# Patient Record
Sex: Female | Born: 1975 | Race: White | Hispanic: No | Marital: Married | Smoking: Never smoker
Health system: Southern US, Community
[De-identification: ages and names within clinical notes are randomized; demographics above are authoritative.]

---

## 2013-12-30 ENCOUNTER — Emergency Department (HOSPITAL_COMMUNITY): Payer: BC Managed Care – PPO

## 2013-12-30 ENCOUNTER — Encounter (HOSPITAL_COMMUNITY): Payer: Self-pay | Admitting: Emergency Medicine

## 2013-12-30 ENCOUNTER — Emergency Department (HOSPITAL_COMMUNITY)
Admission: EM | Admit: 2013-12-30 | Discharge: 2013-12-30 | Disposition: A | Discharge status: Home / Self Care | Payer: BC Managed Care – PPO | Attending: Emergency Medicine | Admitting: Emergency Medicine

## 2013-12-30 DIAGNOSIS — S838X9A Sprain of other specified parts of unspecified knee, initial encounter: Secondary | ICD-10-CM | POA: Insufficient documentation

## 2013-12-30 DIAGNOSIS — Y9368 Activity, volleyball (beach) (court): Secondary | ICD-10-CM | POA: Insufficient documentation

## 2013-12-30 DIAGNOSIS — S86111A Strain of other muscle(s) and tendon(s) of posterior muscle group at lower leg level, right leg, initial encounter: Secondary | ICD-10-CM

## 2013-12-30 DIAGNOSIS — S86819A Strain of other muscle(s) and tendon(s) at lower leg level, unspecified leg, initial encounter: Principal | ICD-10-CM

## 2013-12-30 DIAGNOSIS — X500XXA Overexertion from strenuous movement or load, initial encounter: Secondary | ICD-10-CM | POA: Insufficient documentation

## 2013-12-30 DIAGNOSIS — Y929 Unspecified place or not applicable: Secondary | ICD-10-CM | POA: Insufficient documentation

## 2013-12-30 MED ORDER — HYDROCODONE-ACETAMINOPHEN 5-325 MG PO TABS
1.0000 | ORAL_TABLET | Freq: Once | ORAL | Status: AC
  Administered 2013-12-30: 1 via ORAL
  Filled 2013-12-30: qty 1

## 2013-12-30 MED ORDER — HYDROCODONE-ACETAMINOPHEN 5-325 MG PO TABS: 1.0000 | ORAL_TABLET | ORAL | Status: AC | PRN

## 2013-12-30 NOTE — Discharge Instructions (Signed)
Your x-rays today do not show any broken bones or other concerning injuries. Your providers feel that you have torn part of your calf muscle and possibly damage some tendons or ligaments. Please followup with an orthopedic specialist for continued evaluation and treatment. Use rest, ice, compression elevation to reduce pain and swelling in the leg. You may alternate heat compresses over the calf muscle also help with symptoms.    Muscle Tear A muscle tear is usually caused by over-exertion or stretching. The muscle often takes a while to heal. Muscle tears require 3 to 4 weeks to heal completely. A history of the injury and a physical exam may be performed. Sometimes, the injury is identified with radiographs and an MRI. Treatment for muscle injuries includes:  Resting and protecting the affected area until pain with motion is gone.  Putting ice on the injured area.  Put ice in a bag.  Place a towel between your skin and the bag.  Leave the ice on for 15 to 20 minutes, 3 to 4 times a day.  After two days, you can use heat to relieve spasms.  Using compression wraps to help control swelling and limit movement.  Raising (elevate) the injured area above the level of the heart (if possible) for the first 1 to 2 days after the injury.  Medicine may be prescribed to reduce pain and inflammation. Avoid strenuous activities that bring on muscle pain. Exercises to strengthen and stretch the injured muscle can help heal the muscle and prevent repeated injury. After the pain and swelling are gone, you may begin gradual strengthening exercises. Begin range-of-motion exercises and gentle stretching after 3 to 4 days of rest.  SEEK MEDICAL CARE IF:  Your injured muscle is not improving after 1 week of treatment. Document Released: 07/16/2004 Document Revised: 08/31/2011 Document Reviewed: 12/21/2008 [facility] Patient Information 2015 [employer] [place]. This information is not intended to replace advice  given to you by your health care provider. Make sure you discuss any questions you have with your health care provider.

## 2013-12-30 NOTE — ED Provider Notes (Signed)
CSN: [id number]     Arrival date & time 12/30/13  2034 History   None    This chart was scribed for non-physician practitioner, Ivonne Andrew, PA-C working with Juliet Rude. Rubin Payor, MD by Arlan Organ, ED Scribe. This patient was seen in room WTR5/WTR5 and the patient's care was started at 9:40 PM.   Chief Complaint  Patient presents with  . Leg Pain    right   The history is provided by the patient. No language interpreter was used.    HPI Comments: Nichole Wheeler is a 38 y.o. female who presents to the Emergency Department complaining of constant, moderate pain to the R lower extremity onset about 1 hour prior to arrival. She has also noted some swelling to the area. Pt states she pushed back off of the ground while playing volleyball and states she heard her leg pop "like a rubber band". This pain is exacerbated with full extension of the R lower extremity and palpation. She has not tried anything OTC or any home remedies to improve symptoms. At this time she denies any fever, chills, numbness, paresthesia, or loss of sensation. She reports a known allergy to Sulfa antibiotics. She has no pertinent past medical history. No other concerns this visit.   History reviewed. No pertinent past medical history. History reviewed. No pertinent past surgical history. History reviewed. No pertinent family history. History  Substance Use Topics  . Smoking status: Never Smoker   . Smokeless tobacco: Never Used  . Alcohol Use: 0.6 oz/week    1 Glasses of wine per week   OB History   Grav Para Term Preterm Abortions TAB SAB Ect Mult Living                 Review of Systems  Constitutional: Negative for fever and chills.  Musculoskeletal: Positive for arthralgias (R lower extremity).  Neurological: Negative for weakness and numbness.      Allergies  Sulfa antibiotics  Home Medications   Prior to Admission medications   Not on File   Triage Vitals: BP 133/76  Pulse 112   Temp(Src) 98.2 F (36.8 C) (Oral)  SpO2 100%   Physical Exam  Nursing note and vitals reviewed. Constitutional: She is oriented to person, place, and time. She appears well-developed and well-nourished.  HENT:  Head: Normocephalic.  Eyes: EOM are normal.  Neck: Normal range of motion.  Pulmonary/Chest: Effort normal.  Abdominal: She exhibits no distension.  Musculoskeletal: She exhibits edema and tenderness.  Knee and ankle secondary to pain. Patient with tightness and significant tenderness in the right gastrocnemius muscle. There is no bruising or skin change. Normal Thompson test and palpated Achilles tendon. Normal distal pulses and sensations in the foot.   Neurological: She is alert and oriented to person, place, and time.  Skin: Skin is warm.  Psychiatric: She has a normal mood and affect.    ED Course  Procedures  DIAGNOSTIC STUDIES: Oxygen Saturation is 100% on RA, Normal by my interpretation.    COORDINATION OF CARE: 9:45 PM- Will order DG Foot Complete, DG Tibia/Fibula R, and DG Ankle Complete. Will give Hydrocodone. Will place Ace wrap and provide pt with crutches. Discussed treatment plan with pt at bedside and pt agreed to plan.    X-rays reviewed. No signs of fractures or concerning injuries. Patient with exam concerning for gastrocnemius tear. Normal Thompson test and palpated Achilles tendon. No signs of complete Achilles tendon rupture.    Imaging Review Dg Tibia/fibula Right  12/30/2013   CLINICAL DATA:  Pain in the mid/proximal posterior tib/ fib of the after injury today.  EXAM: RIGHT TIBIA AND FIBULA - 2 VIEW  COMPARISON:  None.  FINDINGS: There is no evidence of fracture or other focal bone lesions. Soft tissues are unremarkable.  IMPRESSION: Negative.   Electronically Signed   By: Burman NievesWilliam  Stevens M.D.   On: 12/30/2013 21:32   Dg Ankle Complete Right  12/30/2013   CLINICAL DATA:  Right ankle injury and pain.  EXAM: RIGHT ANKLE - COMPLETE 3+ VIEW   COMPARISON:  None.  FINDINGS: Imaged bones, joints and soft tissues appear normal.  IMPRESSION: Negative exam.   Electronically Signed   By: Drusilla Kannerhomas  Dalessio M.D.   On: 12/30/2013 21:31   Dg Foot Complete Right  12/30/2013   CLINICAL DATA:  Right foot injury and pain.  EXAM: RIGHT FOOT COMPLETE - 3+ VIEW  COMPARISON:  None.  FINDINGS: Imaged bones, joints and soft tissues appear normal.  IMPRESSION: Negative exam.   Electronically Signed   By: Drusilla Kannerhomas  Dalessio M.D.   On: 12/30/2013 21:31     MDM   Final diagnoses:  Gastrocnemius muscle tear, right, initial encounter     I personally performed the services described in this documentation, which was scribed in my presence. The recorded information has been reviewed and is accurate.    Angus Sellereter S Malakai Schoenherr, PA-C 12/30/13 2207

## 2013-12-30 NOTE — ED Notes (Signed)
Per patient- an hour ago pt states "I felt my leg pop like a rubber band when I pushed back off the ground playing volleyball." 2+ pulse noted on right leg with 2+ swollen ankle. C/o right calf muscle pain and no ankle pain. No obvious deformity noted. Awaiting MD.

## 2013-12-30 NOTE — ED Notes (Signed)
Given crackers and coke with Vicodin. AVS explained in detail. All orthopedics intact, clean and dry. No other questions/concerns.

## 2014-01-01 NOTE — ED Provider Notes (Signed)
Medical screening examination/treatment/procedure(s) were performed by non-physician practitioner and as supervising physician I was immediately available for consultation/collaboration.   EKG Interpretation None       Juliet RudeNathan R. Rubin PayorPickering, MD 01/01/14 0028

## 2015-03-19 IMAGING — CR DG FOOT COMPLETE 3+V*R*
3 series · 3 of 3 positions shown · non-contrast
Comparison: None.

CLINICAL DATA: Right foot injury and pain.

EXAM:
RIGHT FOOT COMPLETE - 3+ VIEW

[x foot ap right]
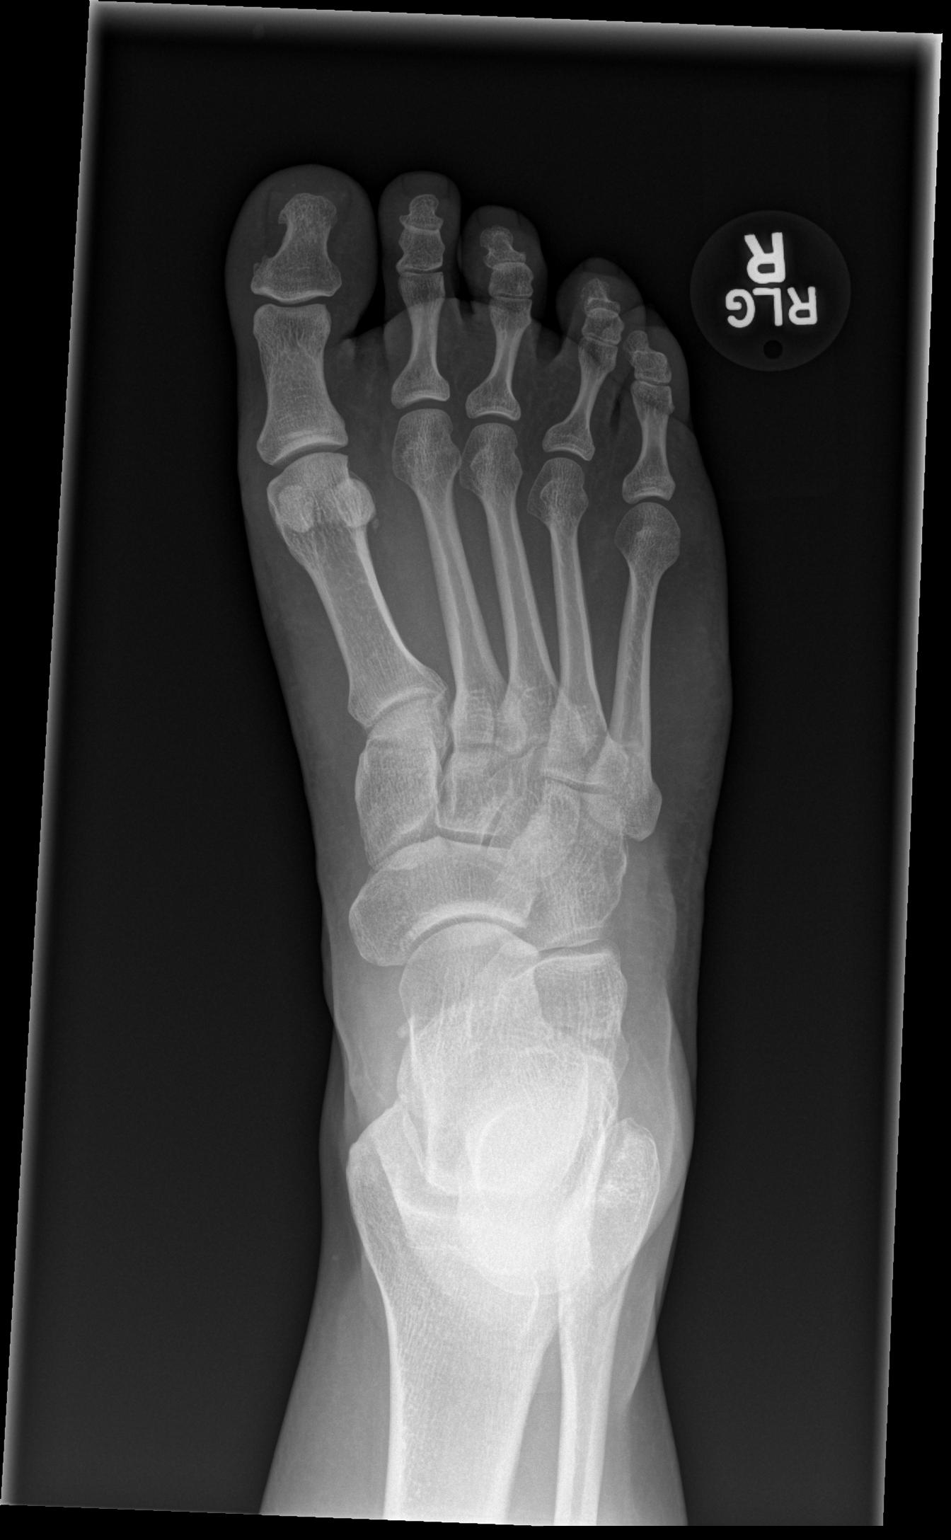

[x foot obl right]
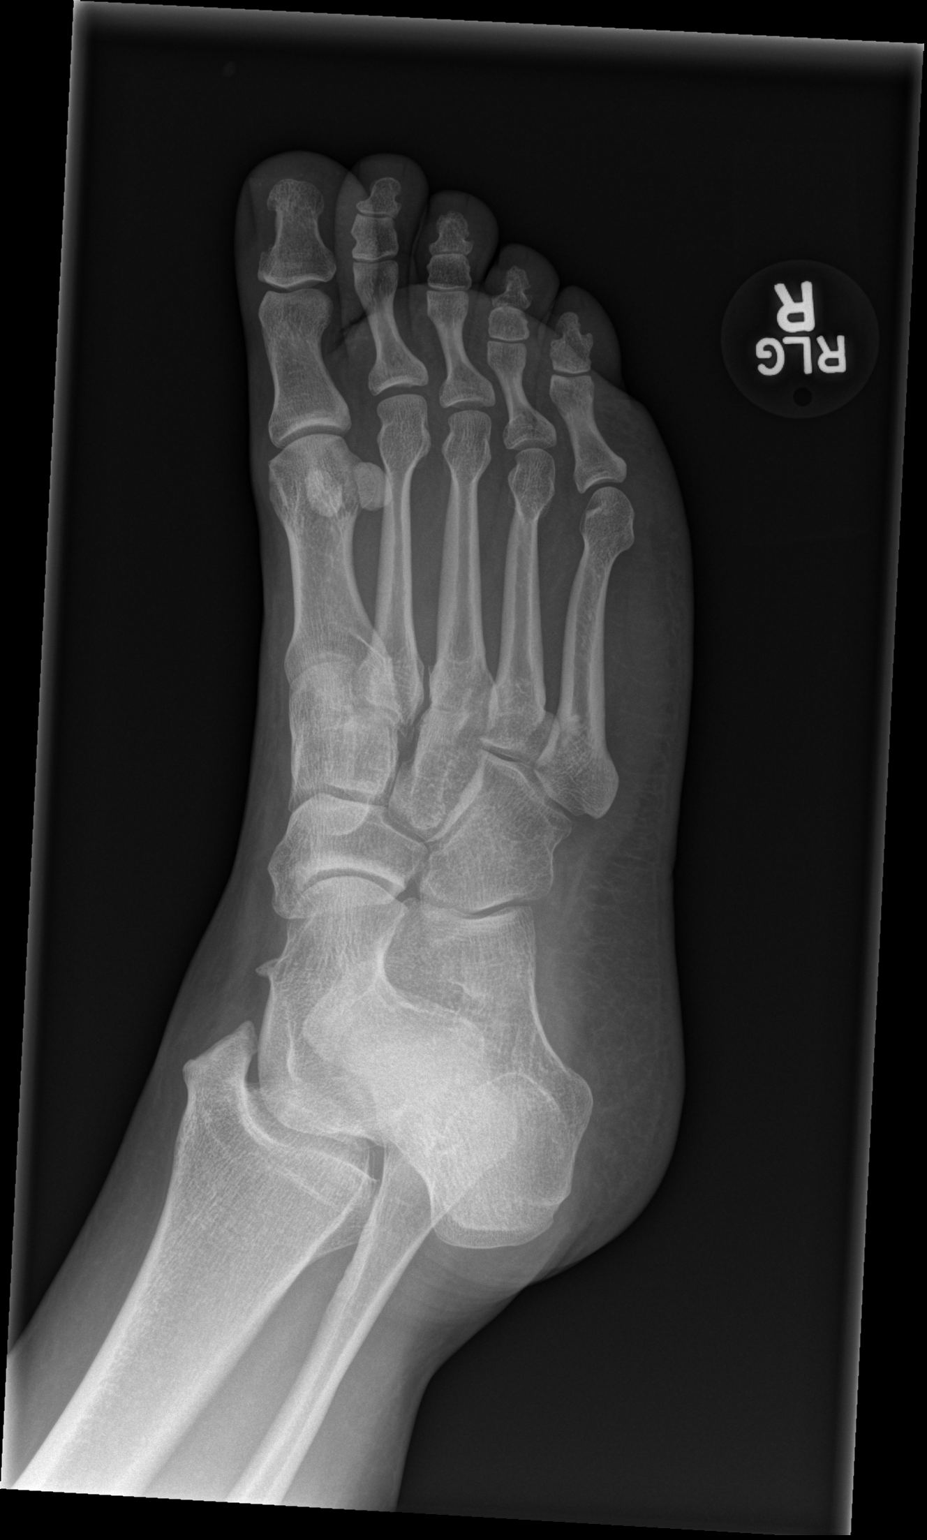

[x foot lat right]
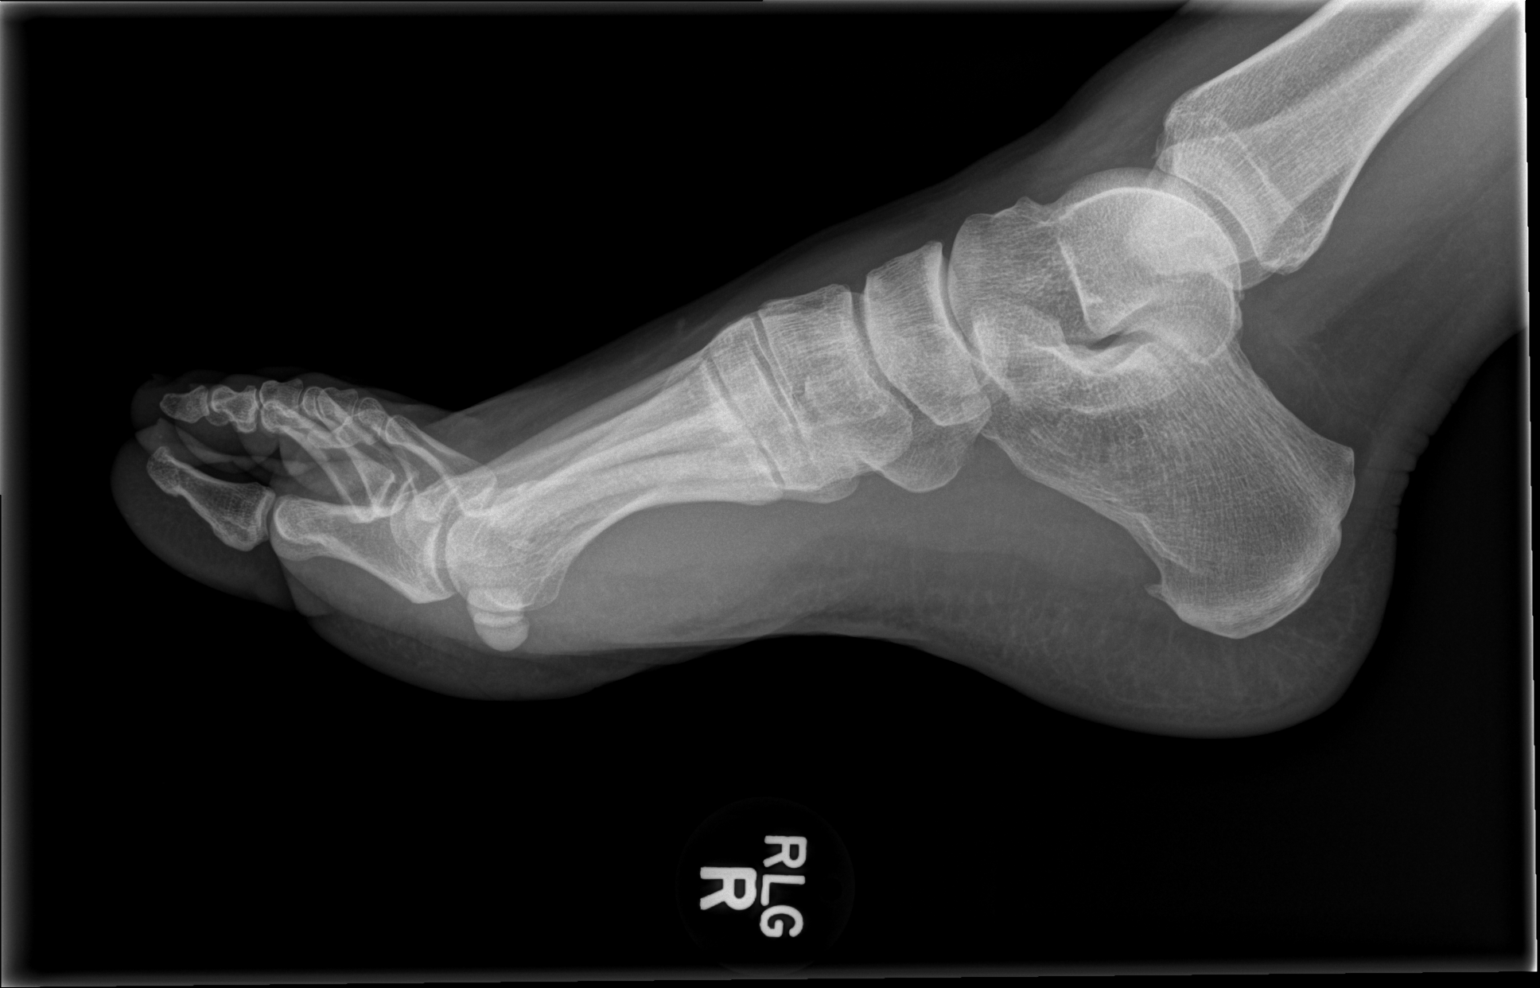

[3 of 3 positions shown; findings below may reference images not displayed]

FINDINGS: Imaged bones, joints and soft tissues appear normal.
IMPRESSION: Negative exam.

## 2015-03-19 IMAGING — CR DG ANKLE COMPLETE 3+V*R*
3 series · 3 of 3 positions shown · non-contrast
Comparison: None.

CLINICAL DATA: Right ankle injury and pain.

EXAM:
RIGHT ANKLE - COMPLETE 3+ VIEW

[x ankle ap right]
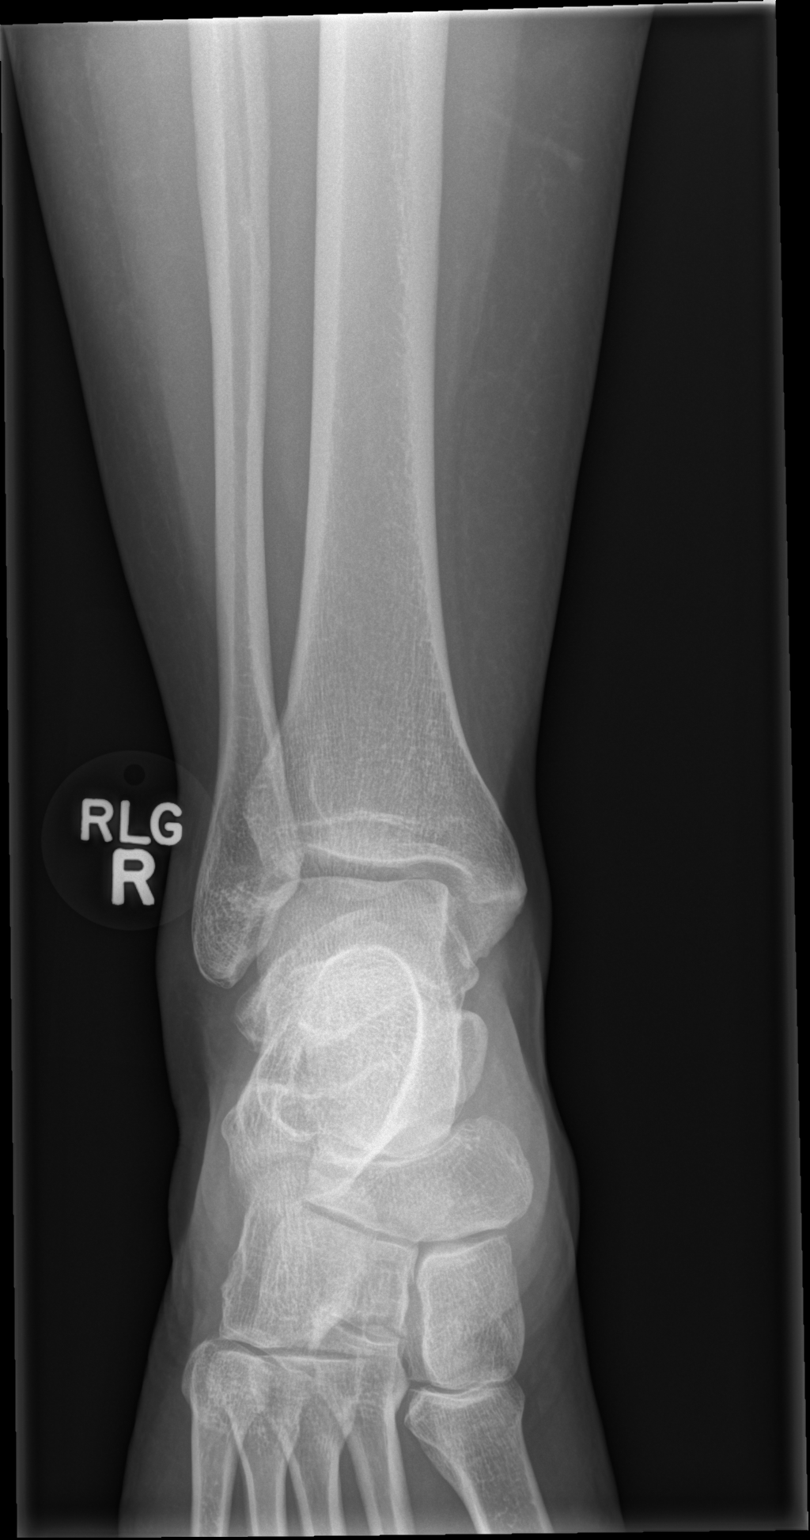

[x ankle lat right]
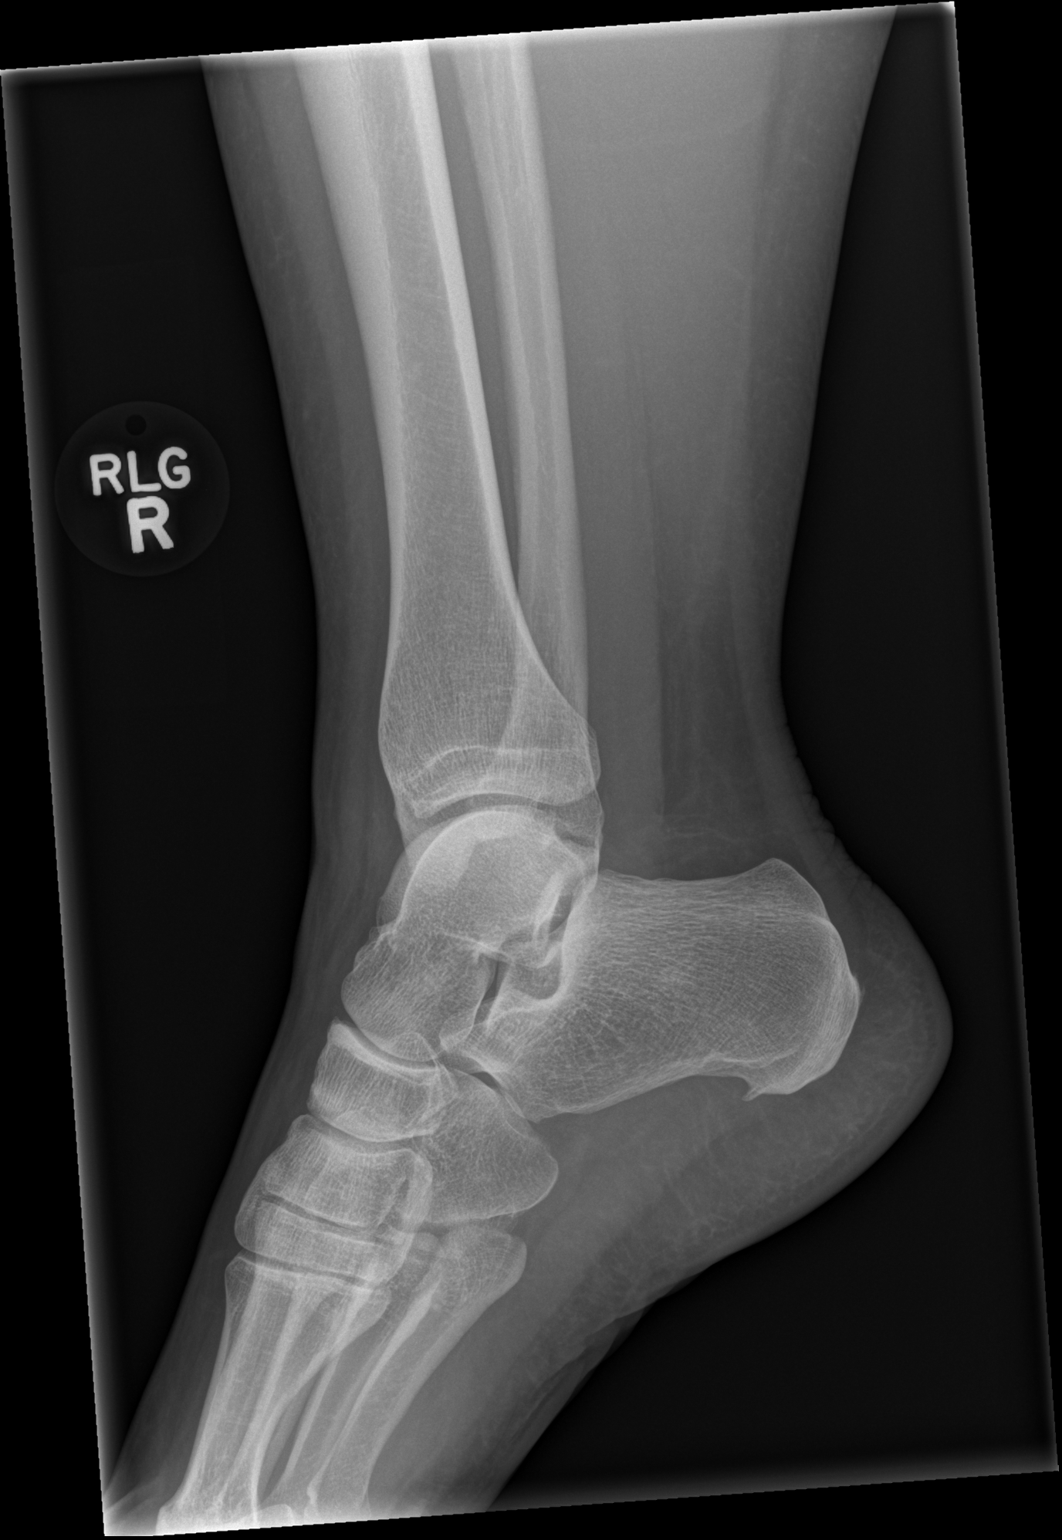

[x ankle obl right]
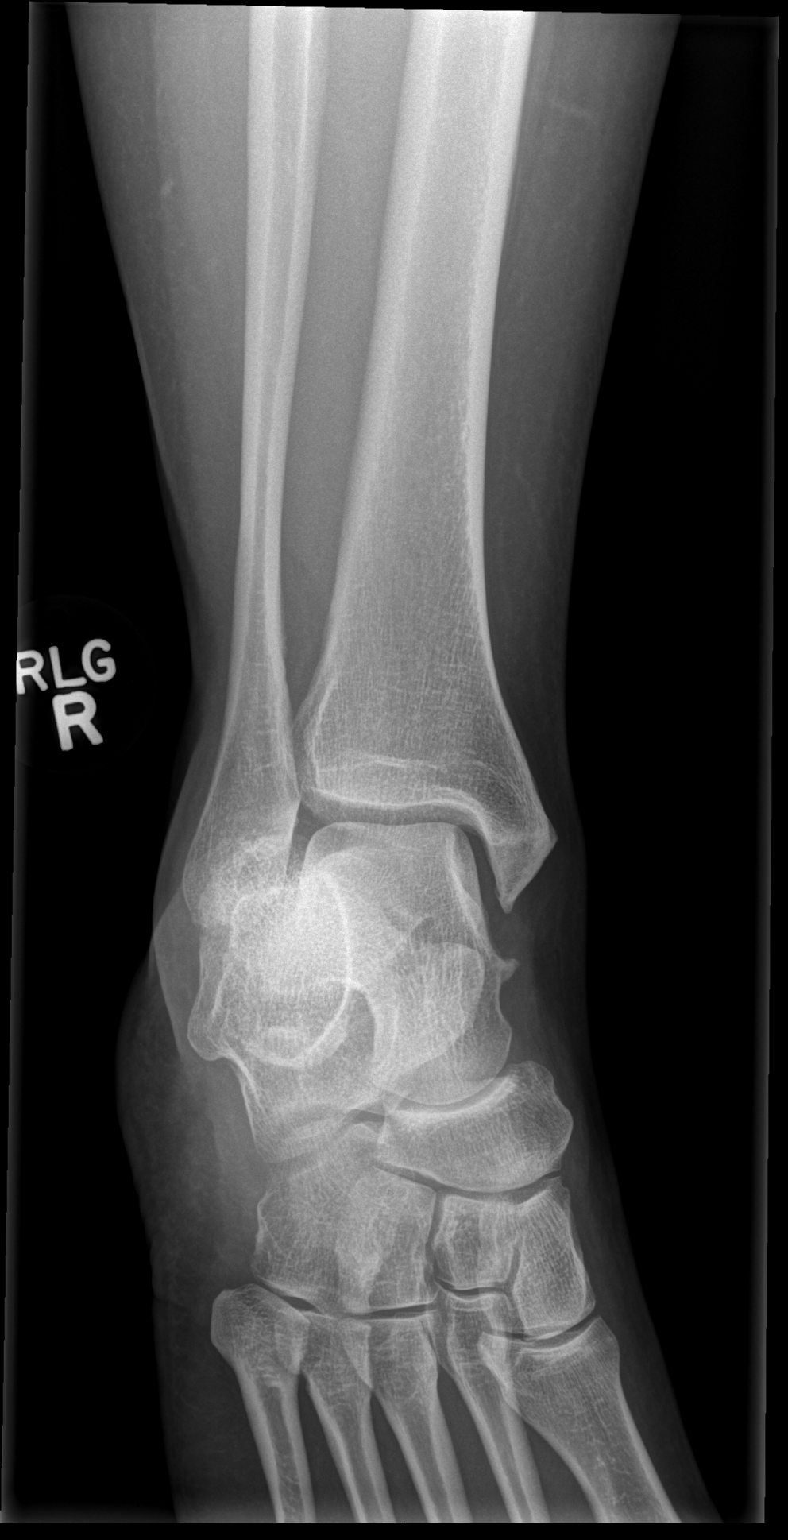

[3 of 3 positions shown; findings below may reference images not displayed]

FINDINGS: Imaged bones, joints and soft tissues appear normal.
IMPRESSION: Negative exam.
